# Patient Record
Sex: Female | Born: 1945 | Race: White | Hispanic: No
Health system: Southern US, Community
[De-identification: ages and names within clinical notes are randomized; demographics above are authoritative.]

---

## 2004-03-18 ENCOUNTER — Ambulatory Visit: Payer: Self-pay

## 2004-03-26 ENCOUNTER — Ambulatory Visit: Payer: Self-pay

## 2004-04-20 ENCOUNTER — Ambulatory Visit: Payer: Self-pay | Admitting: Internal Medicine

## 2004-09-06 ENCOUNTER — Inpatient Hospital Stay: Payer: Self-pay | Admitting: Obstetrics and Gynecology

## 2004-10-20 ENCOUNTER — Ambulatory Visit: Payer: Self-pay | Admitting: Gynecologic Oncology

## 2004-11-10 ENCOUNTER — Ambulatory Visit: Payer: Self-pay | Admitting: Gynecologic Oncology

## 2004-12-15 ENCOUNTER — Ambulatory Visit: Payer: Self-pay | Admitting: Gynecologic Oncology

## 2005-04-25 ENCOUNTER — Ambulatory Visit: Payer: Self-pay | Admitting: Obstetrics and Gynecology

## 2005-04-28 ENCOUNTER — Ambulatory Visit: Payer: Self-pay | Admitting: Obstetrics and Gynecology

## 2005-10-27 ENCOUNTER — Ambulatory Visit: Payer: Self-pay | Admitting: Obstetrics and Gynecology

## 2006-05-12 ENCOUNTER — Ambulatory Visit: Payer: Self-pay | Admitting: Obstetrics and Gynecology

## 2006-06-08 ENCOUNTER — Ambulatory Visit: Payer: Self-pay | Admitting: Obstetrics and Gynecology

## 2007-08-02 ENCOUNTER — Ambulatory Visit: Payer: Self-pay | Admitting: Obstetrics and Gynecology

## 2008-02-28 ENCOUNTER — Ambulatory Visit: Payer: Self-pay | Admitting: Family Medicine

## 2008-03-06 ENCOUNTER — Ambulatory Visit: Payer: Self-pay | Admitting: Family Medicine

## 2008-08-04 ENCOUNTER — Ambulatory Visit: Payer: Self-pay | Admitting: Internal Medicine

## 2008-10-21 ENCOUNTER — Ambulatory Visit: Payer: Self-pay | Admitting: Internal Medicine

## 2008-10-28 ENCOUNTER — Ambulatory Visit: Payer: Self-pay | Admitting: Internal Medicine

## 2008-12-17 ENCOUNTER — Ambulatory Visit: Payer: Self-pay | Admitting: Podiatry

## 2009-10-01 ENCOUNTER — Ambulatory Visit: Payer: Self-pay | Admitting: Internal Medicine

## 2009-10-10 ENCOUNTER — Ambulatory Visit: Payer: Self-pay | Admitting: Gynecologic Oncology

## 2009-10-20 ENCOUNTER — Ambulatory Visit: Payer: Self-pay | Admitting: Gynecologic Oncology

## 2009-11-10 ENCOUNTER — Ambulatory Visit: Payer: Self-pay | Admitting: Gynecologic Oncology

## 2009-12-10 ENCOUNTER — Ambulatory Visit: Payer: Self-pay | Admitting: Oncology

## 2009-12-10 ENCOUNTER — Ambulatory Visit: Payer: Self-pay | Admitting: Gynecologic Oncology

## 2010-01-10 ENCOUNTER — Ambulatory Visit: Payer: Self-pay | Admitting: Oncology

## 2010-01-10 ENCOUNTER — Ambulatory Visit: Payer: Self-pay | Admitting: Gynecologic Oncology

## 2010-01-27 ENCOUNTER — Ambulatory Visit: Payer: Self-pay | Admitting: Surgery

## 2010-02-02 ENCOUNTER — Ambulatory Visit: Payer: Self-pay | Admitting: Surgery

## 2010-02-10 ENCOUNTER — Ambulatory Visit: Payer: Self-pay | Admitting: Gynecologic Oncology

## 2010-02-10 ENCOUNTER — Ambulatory Visit: Payer: Self-pay | Admitting: Oncology

## 2010-03-11 ENCOUNTER — Ambulatory Visit: Payer: Self-pay | Admitting: Gynecologic Oncology

## 2010-03-11 ENCOUNTER — Ambulatory Visit: Payer: Self-pay | Admitting: Oncology

## 2010-04-11 ENCOUNTER — Ambulatory Visit: Payer: Self-pay | Admitting: Oncology

## 2010-04-11 ENCOUNTER — Ambulatory Visit: Payer: Self-pay | Admitting: Gynecologic Oncology

## 2010-05-11 ENCOUNTER — Ambulatory Visit: Payer: Self-pay | Admitting: Gynecologic Oncology

## 2010-05-11 ENCOUNTER — Ambulatory Visit: Payer: Self-pay | Admitting: Oncology

## 2010-06-11 ENCOUNTER — Ambulatory Visit: Payer: Self-pay | Admitting: Gynecologic Oncology

## 2010-06-11 ENCOUNTER — Ambulatory Visit: Payer: Self-pay | Admitting: Oncology

## 2010-07-11 ENCOUNTER — Ambulatory Visit: Payer: Self-pay | Admitting: Gynecologic Oncology

## 2010-07-11 ENCOUNTER — Ambulatory Visit: Payer: Self-pay | Admitting: Oncology

## 2010-08-11 ENCOUNTER — Ambulatory Visit: Payer: Self-pay | Admitting: Oncology

## 2010-08-11 ENCOUNTER — Ambulatory Visit: Payer: Self-pay | Admitting: Gynecologic Oncology

## 2010-09-11 ENCOUNTER — Ambulatory Visit: Payer: Self-pay | Admitting: Gynecologic Oncology

## 2010-09-11 ENCOUNTER — Ambulatory Visit: Payer: Self-pay | Admitting: Oncology

## 2010-10-11 ENCOUNTER — Ambulatory Visit: Payer: Self-pay | Admitting: Internal Medicine

## 2010-10-11 ENCOUNTER — Ambulatory Visit: Payer: Self-pay | Admitting: Gynecologic Oncology

## 2010-10-11 ENCOUNTER — Ambulatory Visit: Payer: Self-pay | Admitting: Surgery

## 2010-10-12 ENCOUNTER — Ambulatory Visit: Payer: Self-pay | Admitting: Gynecologic Oncology

## 2010-10-19 ENCOUNTER — Inpatient Hospital Stay: Payer: Self-pay | Admitting: Surgery

## 2010-10-21 LAB — PATHOLOGY REPORT

## 2010-11-11 ENCOUNTER — Ambulatory Visit: Payer: Self-pay | Admitting: Gynecologic Oncology

## 2010-11-11 ENCOUNTER — Ambulatory Visit: Payer: Self-pay | Admitting: Internal Medicine

## 2010-12-11 ENCOUNTER — Ambulatory Visit: Payer: Self-pay | Admitting: Oncology

## 2010-12-11 ENCOUNTER — Ambulatory Visit: Payer: Self-pay | Admitting: Internal Medicine

## 2010-12-11 ENCOUNTER — Ambulatory Visit: Payer: Self-pay | Admitting: Gynecologic Oncology

## 2011-01-11 ENCOUNTER — Ambulatory Visit: Payer: Self-pay | Admitting: Oncology

## 2011-01-11 ENCOUNTER — Ambulatory Visit: Payer: Self-pay | Admitting: Internal Medicine

## 2011-01-11 ENCOUNTER — Ambulatory Visit: Payer: Self-pay | Admitting: Gynecologic Oncology

## 2011-01-26 LAB — COMPREHENSIVE METABOLIC PANEL
Albumin: 3.4 g/dL (ref 3.4–5.0)
Anion Gap: 4 — ABNORMAL LOW (ref 7–16)
Bilirubin,Total: 1.2 mg/dL — ABNORMAL HIGH (ref 0.2–1.0)
Calcium, Total: 9.2 mg/dL (ref 8.5–10.1)
Chloride: 97 mmol/L — ABNORMAL LOW (ref 98–107)
Co2: 36 mmol/L — ABNORMAL HIGH (ref 21–32)
EGFR (African American): 40 — ABNORMAL LOW
EGFR (Non-African Amer.): 33 — ABNORMAL LOW
Glucose: 151 mg/dL — ABNORMAL HIGH (ref 65–99)
Osmolality: 280 (ref 275–301)
Potassium: 3.1 mmol/L — ABNORMAL LOW (ref 3.5–5.1)
SGOT(AST): 38 U/L — ABNORMAL HIGH (ref 15–37)
SGPT (ALT): 25 U/L
Sodium: 137 mmol/L (ref 136–145)

## 2011-01-26 LAB — CBC CANCER CENTER
Basophil #: 0 x10 3/mm (ref 0.0–0.1)
Eosinophil #: 0 x10 3/mm (ref 0.0–0.7)
HGB: 14.9 g/dL (ref 12.0–16.0)
Lymphocyte %: 18.1 %
MCHC: 33.5 g/dL (ref 32.0–36.0)
MCV: 91 fL (ref 80–100)
Neutrophil %: 71.6 %
Platelet: 207 x10 3/mm (ref 150–440)
RDW: 19 % — ABNORMAL HIGH (ref 11.5–14.5)
WBC: 4.7 x10 3/mm (ref 3.6–11.0)

## 2011-02-01 LAB — URINALYSIS, COMPLETE
Bacteria: NONE SEEN
Bilirubin,UR: NEGATIVE
Hyaline Cast: 3
Ketone: NEGATIVE
Leukocyte Esterase: NEGATIVE
Ph: 5 (ref 4.5–8.0)
Protein: 100
RBC,UR: 2 /HPF (ref 0–5)
Squamous Epithelial: 1
WBC UR: 5 /HPF (ref 0–5)

## 2011-02-02 LAB — URINE CULTURE

## 2011-02-03 LAB — CBC CANCER CENTER
Basophil %: 0.3 %
Eosinophil #: 0.1 x10 3/mm (ref 0.0–0.7)
Eosinophil %: 1.8 %
HCT: 36.6 % (ref 35.0–47.0)
HGB: 12.3 g/dL (ref 12.0–16.0)
Lymphocyte #: 0.7 x10 3/mm — ABNORMAL LOW (ref 1.0–3.6)
Lymphocyte %: 11.7 %
MCH: 31.4 pg (ref 26.0–34.0)
MCHC: 33.5 g/dL (ref 32.0–36.0)
MCV: 94 fL (ref 80–100)
Monocyte #: 0.4 x10 3/mm (ref 0.0–0.7)
Monocyte %: 7.6 %
Platelet: 227 x10 3/mm (ref 150–440)
RDW: 19.5 % — ABNORMAL HIGH (ref 11.5–14.5)

## 2011-02-03 LAB — COMPREHENSIVE METABOLIC PANEL
Albumin: 3 g/dL — ABNORMAL LOW (ref 3.4–5.0)
BUN: 25 mg/dL — ABNORMAL HIGH (ref 7–18)
Bilirubin,Total: 0.5 mg/dL (ref 0.2–1.0)
Calcium, Total: 8.3 mg/dL — ABNORMAL LOW (ref 8.5–10.1)
Co2: 27 mmol/L (ref 21–32)
Creatinine: 1.67 mg/dL — ABNORMAL HIGH (ref 0.60–1.30)
EGFR (African American): 40 — ABNORMAL LOW
EGFR (Non-African Amer.): 33 — ABNORMAL LOW
Glucose: 193 mg/dL — ABNORMAL HIGH (ref 65–99)
Osmolality: 282 (ref 275–301)
Potassium: 2.8 mmol/L — ABNORMAL LOW (ref 3.5–5.1)
SGOT(AST): 38 U/L — ABNORMAL HIGH (ref 15–37)
SGPT (ALT): 21 U/L
Total Protein: 6.5 g/dL (ref 6.4–8.2)

## 2011-02-10 LAB — CBC CANCER CENTER
Basophil #: 0 x10 3/mm (ref 0.0–0.1)
Eosinophil #: 0 x10 3/mm (ref 0.0–0.7)
Eosinophil %: 5.3 %
HCT: 32.7 % — ABNORMAL LOW (ref 35.0–47.0)
HGB: 10.9 g/dL — ABNORMAL LOW (ref 12.0–16.0)
Lymphocyte #: 0.5 x10 3/mm — ABNORMAL LOW (ref 1.0–3.6)
Lymphocyte %: 53.2 %
MCV: 96 fL (ref 80–100)
Monocyte %: 4.3 %
Neutrophil #: 0.3 x10 3/mm — ABNORMAL LOW (ref 1.4–6.5)
Neutrophil %: 35.8 %
Platelet: 134 x10 3/mm — ABNORMAL LOW (ref 150–440)
RBC: 3.42 10*6/uL — ABNORMAL LOW (ref 3.80–5.20)
RDW: 18.4 % — ABNORMAL HIGH (ref 11.5–14.5)
WBC: 0.9 x10 3/mm — CL (ref 3.6–11.0)

## 2011-02-10 LAB — BASIC METABOLIC PANEL
Anion Gap: 8 (ref 7–16)
Calcium, Total: 8.9 mg/dL (ref 8.5–10.1)
Co2: 30 mmol/L (ref 21–32)
EGFR (African American): 50 — ABNORMAL LOW
Sodium: 138 mmol/L (ref 136–145)

## 2011-02-10 LAB — MAGNESIUM: Magnesium: 1.6 mg/dL — ABNORMAL LOW

## 2011-02-11 ENCOUNTER — Ambulatory Visit: Payer: Self-pay | Admitting: Oncology

## 2011-02-11 ENCOUNTER — Ambulatory Visit: Payer: Self-pay | Admitting: Gynecologic Oncology

## 2011-02-14 LAB — CBC CANCER CENTER
Basophil #: 0 x10 3/mm (ref 0.0–0.1)
Basophil %: 0.3 %
Eosinophil #: 0 x10 3/mm (ref 0.0–0.7)
MCH: 32.4 pg (ref 26.0–34.0)
MCHC: 34.6 g/dL (ref 32.0–36.0)
MCV: 94 fL (ref 80–100)
Monocyte #: 0.4 x10 3/mm (ref 0.0–0.7)
Platelet: 135 x10 3/mm — ABNORMAL LOW (ref 150–440)
RBC: 3.17 10*6/uL — ABNORMAL LOW (ref 3.80–5.20)
RDW: 18 % — ABNORMAL HIGH (ref 11.5–14.5)
WBC: 4.1 x10 3/mm (ref 3.6–11.0)

## 2011-02-14 LAB — CREATININE, SERUM: EGFR (African American): 37 — ABNORMAL LOW

## 2011-02-24 ENCOUNTER — Inpatient Hospital Stay: Payer: Self-pay | Admitting: Oncology

## 2011-02-24 LAB — COMPREHENSIVE METABOLIC PANEL WITH GFR
Albumin: 2.9 g/dL — ABNORMAL LOW
Alkaline Phosphatase: 132 U/L
Anion Gap: 9
BUN: 31 mg/dL — ABNORMAL HIGH
Bilirubin,Total: 0.4 mg/dL
Calcium, Total: 8.5 mg/dL
Chloride: 99 mmol/L
Co2: 31 mmol/L
Creatinine: 1.85 mg/dL — ABNORMAL HIGH
EGFR (African American): 35 — ABNORMAL LOW
EGFR (Non-African Amer.): 29 — ABNORMAL LOW
Glucose: 103 mg/dL — ABNORMAL HIGH
Osmolality: 284
Potassium: 3.4 mmol/L — ABNORMAL LOW
SGOT(AST): 30 U/L
SGPT (ALT): 16 U/L
Sodium: 139 mmol/L
Total Protein: 6.4 g/dL

## 2011-02-24 LAB — CBC CANCER CENTER
Basophil #: 0 "x10 3/mm "
Basophil %: 0.4 %
Eosinophil #: 0 "x10 3/mm "
Eosinophil %: 0.2 %
HCT: 28.3 % — ABNORMAL LOW
HGB: 9.6 g/dL — ABNORMAL LOW
Lymphocyte %: 9.6 %
Lymphs Abs: 0.8 "x10 3/mm " — ABNORMAL LOW
MCH: 32.2 pg
MCHC: 34.1 g/dL
MCV: 95 fL
Monocyte #: 1.1 "x10 3/mm " — ABNORMAL HIGH
Monocyte %: 13.5 %
Neutrophil #: 6.3 "x10 3/mm "
Neutrophil %: 76.3 %
Platelet: 313 "x10 3/mm "
RBC: 2.99 "x10 6/mm " — ABNORMAL LOW
RDW: 17.4 % — ABNORMAL HIGH
WBC: 8.2 "x10 3/mm "

## 2011-02-24 LAB — APTT: Activated PTT: 32 s

## 2011-02-25 LAB — CBC WITH DIFFERENTIAL/PLATELET
Basophil %: 0.5 %
Eosinophil #: 0 10*3/uL (ref 0.0–0.7)
Eosinophil %: 0.3 %
Lymphocyte #: 1.2 10*3/uL (ref 1.0–3.6)
MCH: 32.5 pg (ref 26.0–34.0)
MCV: 96 fL (ref 80–100)
Monocyte #: 1.1 10*3/uL — ABNORMAL HIGH (ref 0.0–0.7)
Neutrophil #: 7.3 10*3/uL — ABNORMAL HIGH (ref 1.4–6.5)
Platelet: 305 10*3/uL (ref 150–440)
RBC: 3 10*6/uL — ABNORMAL LOW (ref 3.80–5.20)
RDW: 17.2 % — ABNORMAL HIGH (ref 11.5–14.5)

## 2011-02-25 LAB — COMPREHENSIVE METABOLIC PANEL
Anion Gap: 9 (ref 7–16)
BUN: 32 mg/dL — ABNORMAL HIGH (ref 7–18)
Bilirubin,Total: 0.5 mg/dL (ref 0.2–1.0)
Calcium, Total: 8.6 mg/dL (ref 8.5–10.1)
Chloride: 99 mmol/L (ref 98–107)
Creatinine: 1.91 mg/dL — ABNORMAL HIGH (ref 0.60–1.30)
EGFR (African American): 34 — ABNORMAL LOW
EGFR (Non-African Amer.): 28 — ABNORMAL LOW
Glucose: 91 mg/dL (ref 65–99)
SGOT(AST): 30 U/L (ref 15–37)
Total Protein: 6.4 g/dL (ref 6.4–8.2)

## 2011-02-25 LAB — APTT: Activated PTT: >160 secs (ref 23.6–35.9)

## 2011-02-26 LAB — PROTIME-INR: INR: 1

## 2011-02-27 LAB — APTT: Activated PTT: 86.6 secs — ABNORMAL HIGH (ref 23.6–35.9)

## 2011-02-27 LAB — PLATELET COUNT: Platelet: 375 10*3/uL (ref 150–440)

## 2011-02-28 LAB — APTT: Activated PTT: 88.8 secs — ABNORMAL HIGH (ref 23.6–35.9)

## 2011-02-28 LAB — PROTIME-INR: Prothrombin Time: 15.8 secs — ABNORMAL HIGH (ref 11.5–14.7)

## 2011-03-11 ENCOUNTER — Ambulatory Visit: Payer: Self-pay | Admitting: Oncology

## 2011-03-11 ENCOUNTER — Ambulatory Visit: Payer: Self-pay | Admitting: Gynecologic Oncology

## 2011-04-11 DEATH — deceased

## 2012-04-17 IMAGING — NM NUCLEAR MEDICINE CARDIAC MULTIPLE UPTAKE GATED ACQUISITION SCAN
4 series · 24 of 24 positions shown · non-contrast
Comparison: none

REASON FOR EXAM: uterine sarcoma high risk chemo
COMMENTS:

PROCEDURE:     KNM - KNM REST MUGA SCAN [DATE] OF [DATE]  - [DATE] [DATE] [DATE]  [DATE]
RESULT:     The patient received 3 mL PYP labeled with 23.77 mCi of
technetium 99m. This is via a left antecubital injection. Standard imaging
of the heart and mediastinum were obtained.

[Series 1000: lao 45-gated · 3.30mm/px · 6 of 24 frames shown]
[frame 3/24]
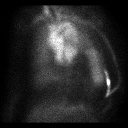
[frame 7/24]
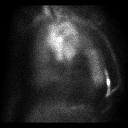
[frame 11/24]
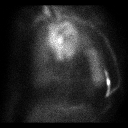
[frame 15/24]
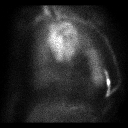
[frame 19/24]
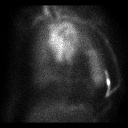
[frame 23/24]
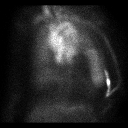

[Series 1000: lao 45-gated (results) · 3.30mm/px · 6 of 24 frames shown]
[frame 3/24]
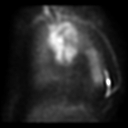
[frame 7/24]
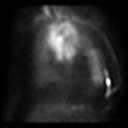
[frame 11/24]
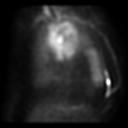
[frame 15/24]
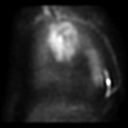
[frame 19/24]
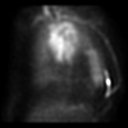
[frame 23/24]
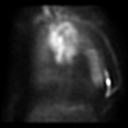

[Series 1000: lao 70-gated · 3.30mm/px · 6 of 24 frames shown]
[frame 3/24]
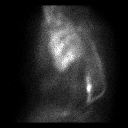
[frame 7/24]
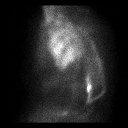
[frame 11/24]
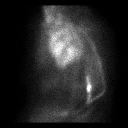
[frame 15/24]
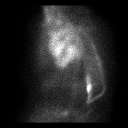
[frame 19/24]
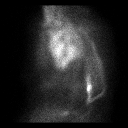
[frame 23/24]
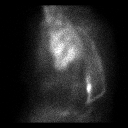

[Series 1000: ant-gated · 3.30mm/px · 6 of 24 frames shown]
[frame 3/24]
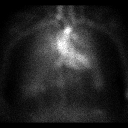
[frame 7/24]
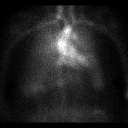
[frame 11/24]
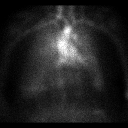
[frame 15/24]
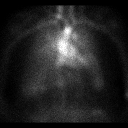
[frame 19/24]
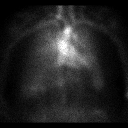
[frame 23/24]
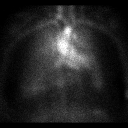

[24 of 24 positions shown; findings below may reference images not displayed]

FINDINGS: The patient's ventricular ejection fraction is 54.9%.
IMPRESSION: Left ventricular ejection fraction within the lower limits of normal as
described above.

## 2012-05-10 IMAGING — US US EXTREM LOW VENOUS*R*
1 series · 17 of 24 positions shown · non-contrast
Comparison: none

REASON FOR EXAM: CR [id number] EXT  swelling Eval for DVT
COMMENTS:

PROCEDURE:     US  - US DOPPLER LOW EXTR RIGHT  - February 24, 2011  [DATE]
RESULT:     Standard right lower extremity color flow duplex Doppler reveals
no evidence of deep venous thrombosis. Mild amount of thrombus is noted in
the proximal saphenous vein.

[Series 1: us extrem low venous*right* · 17 of 46 slices shown]
[im 1/46]
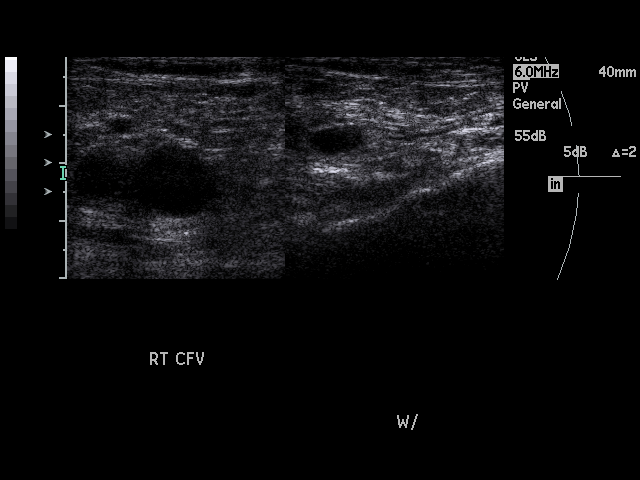
[im 4/46]
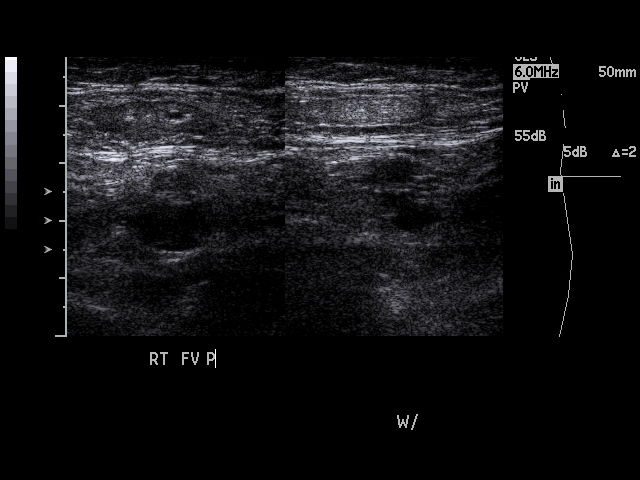
[im 6/46]
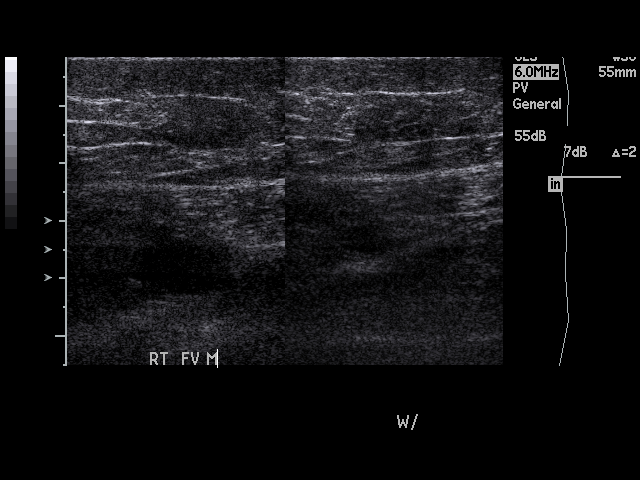
[im 8/46]
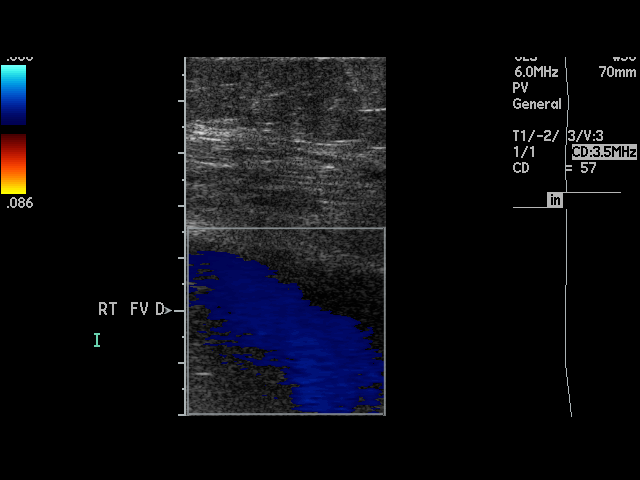
[im 12/46]
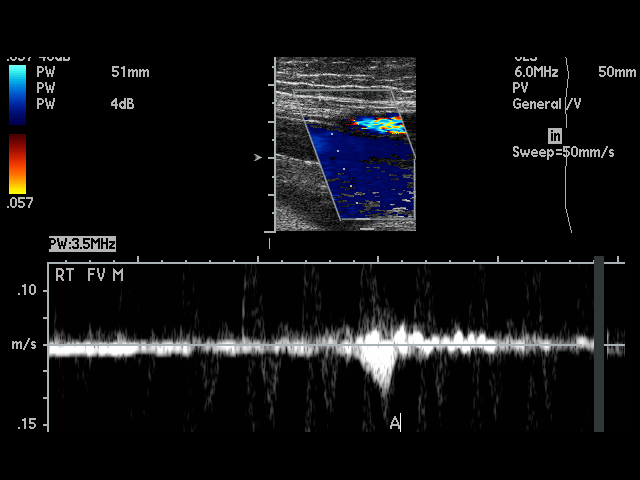
[im 14/46]
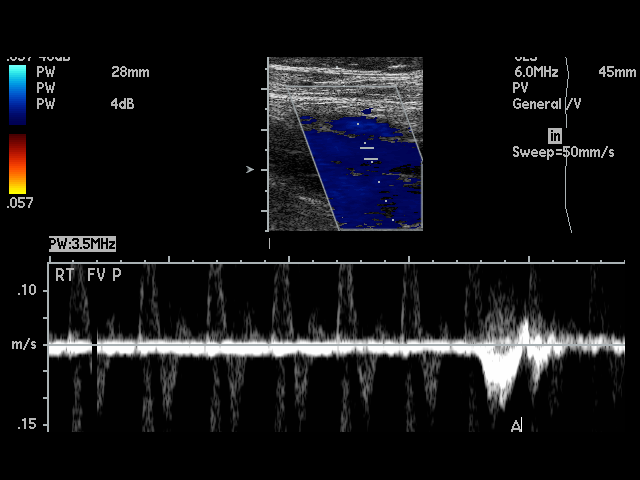
[im 18/46]
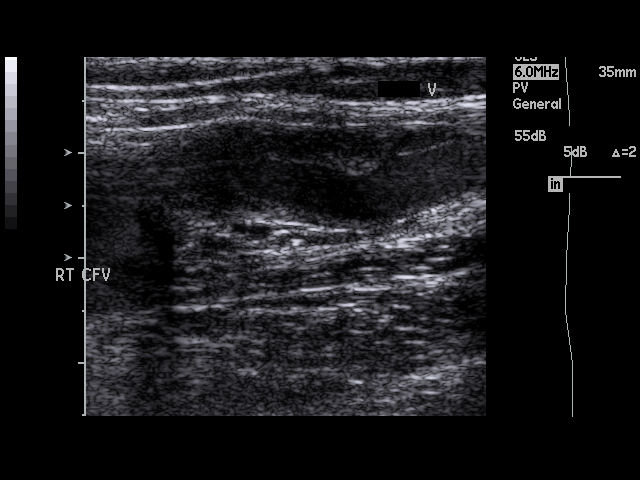
[im 20/46]
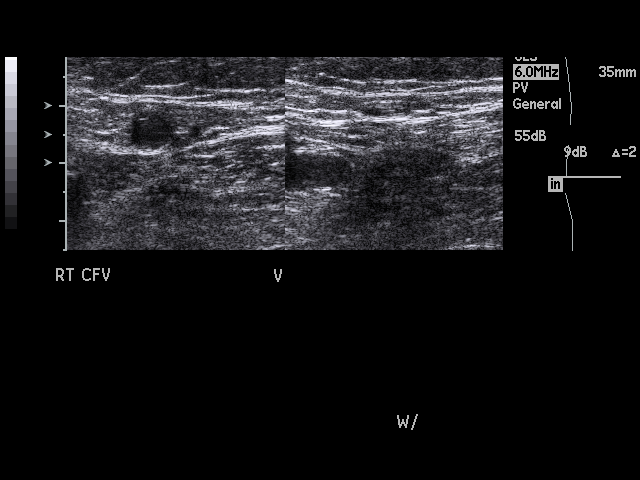
[im 24/46]
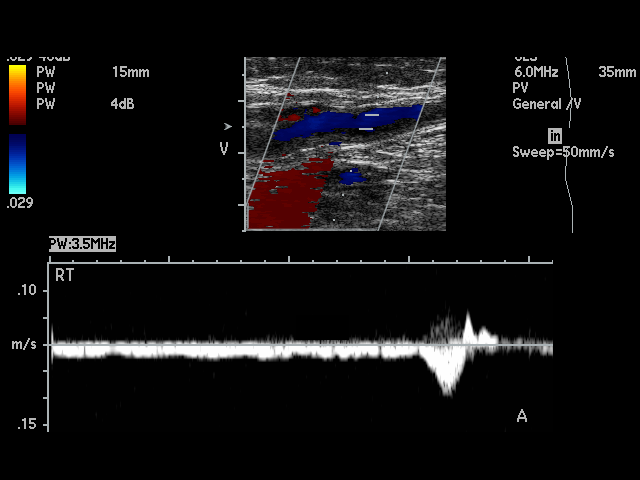
[im 26/46]
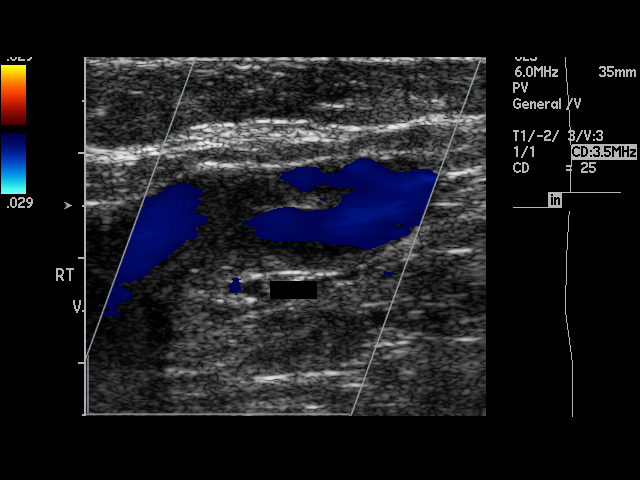
[im 28/46]
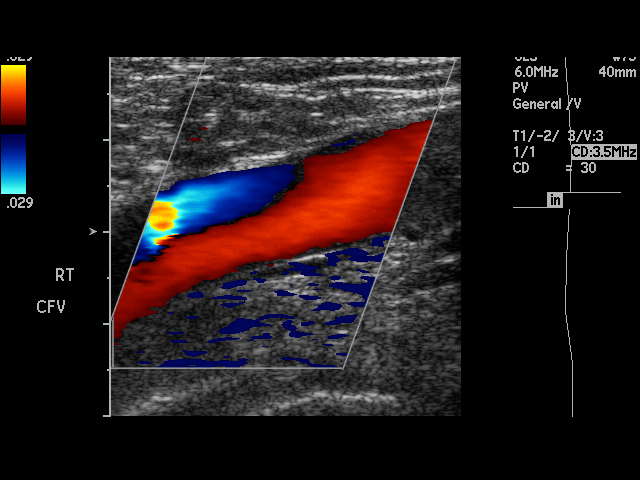
[im 32/46]
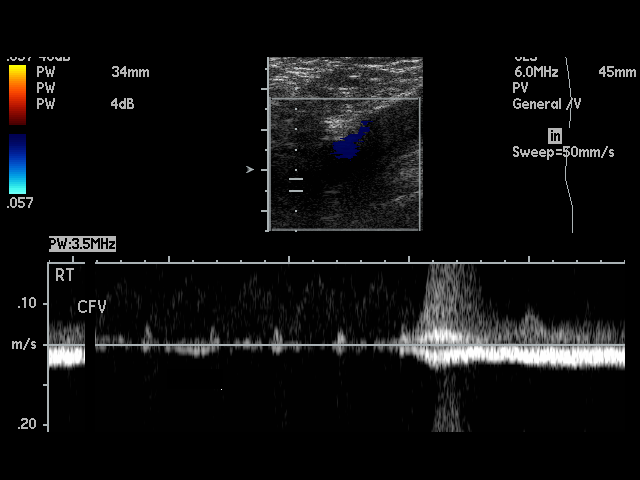
[im 34/46]
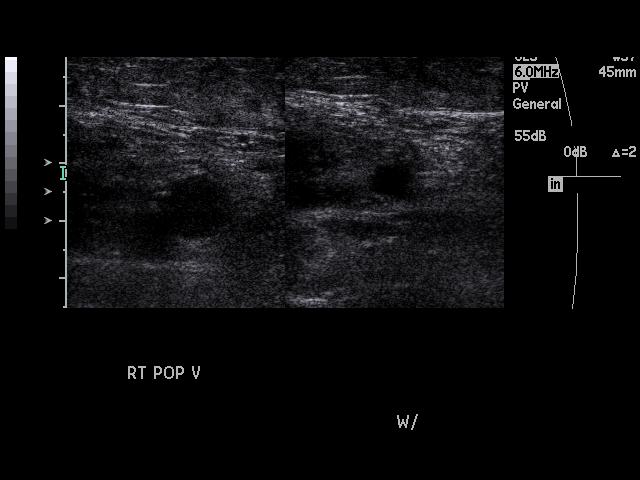
[im 38/46]
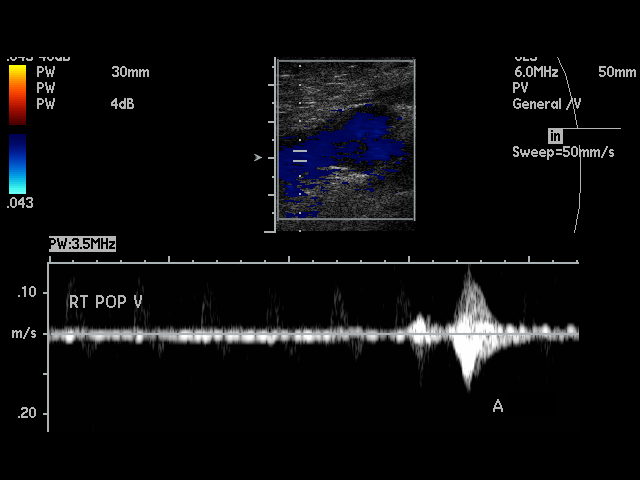
[im 40/46]
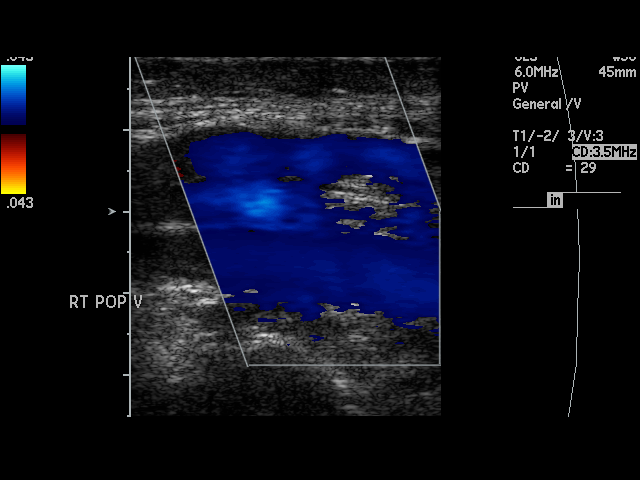
[im 42/46]
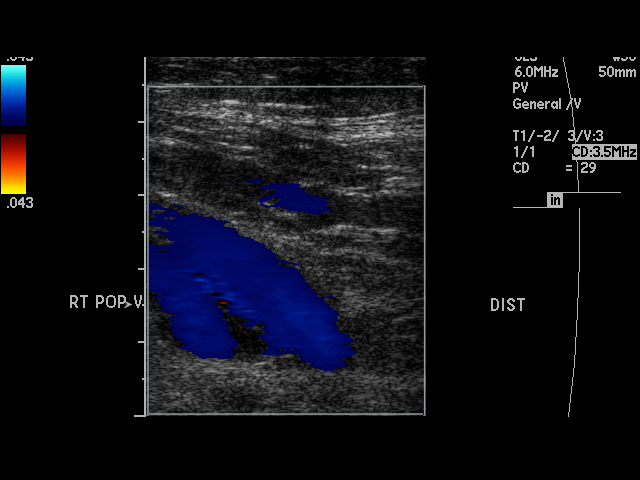
[im 46/46]
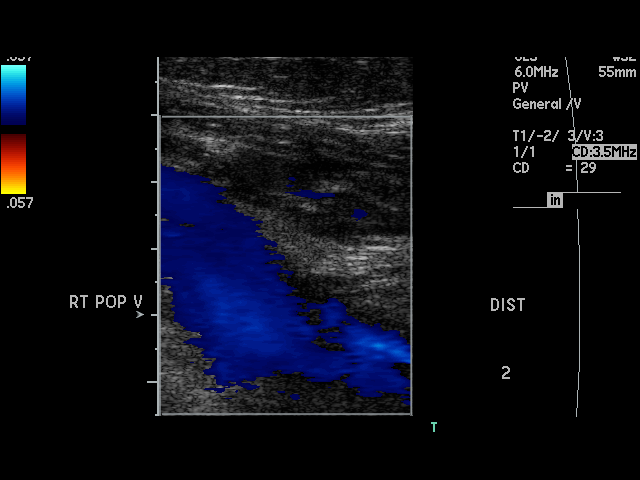

[17 of 24 positions shown; findings below may reference images not displayed]

IMPRESSION: 1. Mild amount of thrombus noted in the proximal saphenous vein.
2. No evidence of deep venous thrombosis.

## 2014-05-04 NOTE — Discharge Summary (Signed)
PATIENT NAME:  Whitney DavidsonEWNAM, [name] MR#:  [id number] DATE OF BIRTH:  Dec 20, 1945  DATE OF ADMISSION:  02/24/2011 DATE OF DISCHARGE:  02/28/2011  HISTORY OF PRESENT ILLNESS: [name] is a known patient to me with history of leiomyosarcoma of uterus operated in 2005 with bilateral salpingo-oophorectomy and hysterectomy. 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 The patient underwent resection followed by chemotherapy. The patient had recurrent disease in September 2012 and underwent resection, but was found to have unresectable cancer. She had failed multiple chemotherapy. She was admitted with a history of gradually declining condition, swelling of lower extremity, increasing abdominal distention, increasing pain and discomfort.   The patient's past history and social and family has been dictated and noted on the chart.   AVAILABLE LAB DATA/X-RAY DATA: Albumin 2.9 and SGOT 30. Glucose was 91 and  creatinine was 1.91. CBC: Hemoglobin was 9.8 and white count 9.8.   Doppler study of both lower extremities was showing extensive partially obstructing vein thromboses.   HOSPITAL COURSE: During the hospital stay, the patient was started on heparin and then started on Coumadin. Swelling improved. The patient's pain was better controlled. I had a detailed discussion with the family regarding overall poor prognosis. The patient and the family did not want any further treatment, hospice was consulted. The patient was discharged with advice to get follow-up with hospice care.   FINAL DIAGNOSES:  1. Deep vein thrombosis with swelling of lower extremities.  2. Leiomyosarcoma, recurrent disease, progressing disease.   DISCHARGE MEDICATIONS:  1. Lasix 40 mg p.o. daily.  2. Aldactone 100 mg p.o. daily.  3. Mag-Oxide 1 tablet once a day for 30 days.  4. Prilosec 40 mg 1 tablet daily.  5. Lactulose 10 grams p.o. twice a day. 6. Coumadin 2.5 mg p.o. daily.   7. Fentanyl patch 12 mcg every 72 hours.  8. Xanax 0.25 mg p.o. twice a day.  DIET: Regular.   ACTIVITY: As tolerated.   REFERRAL: The patient was referred to hospice.  OVERALL PROGNOSIS: Poor because of underlying disease. ____________________________ Gerome SamJanak K. Kyannah Climer, MD [id number] D: 03/08/2011 20:10:00 ET T: 03/09/2011 08:30:35 ET JOB#: [id number]  cc: Valarie ConesJanak K. Doylene Canninghoksi, MD, <Dictator> Laddie AquasJANAK K Carmino Ocain MD ELECTRONICALLY SIGNED 03/09/2011 20:23
